# Patient Record
Sex: Male | Born: 1995 | Race: Black or African American | Marital: Single | Smoking: Never smoker
Health system: Southern US, Community
[De-identification: ages and names within clinical notes are randomized; demographics above are authoritative.]

---
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
## 2016-05-02 ENCOUNTER — Encounter: Payer: Self-pay | Admitting: Family Medicine

## 2016-05-02 ENCOUNTER — Ambulatory Visit (INDEPENDENT_AMBULATORY_CARE_PROVIDER_SITE_OTHER): Payer: Medicaid Other | Admitting: Family Medicine

## 2016-05-02 DIAGNOSIS — L089 Local infection of the skin and subcutaneous tissue, unspecified: Secondary | ICD-10-CM

## 2016-05-02 MED ORDER — SULFAMETHOXAZOLE-TRIMETHOPRIM 800-160 MG PO TABS: 1.0000 | ORAL_TABLET | Freq: Two times a day (BID) | ORAL | 0 refills | Status: AC

## 2016-05-02 NOTE — Progress Notes (Signed)
Patient presents today with painful fluid filled lesion on his right third distal digit. Patient states that he noticed this for 5 days ago. He denies any injury to the digit. He denies any fever or chills or myalgias. He states that the trainer tried to express fluid from the area and clear pussy fluid came out. He now has 2 smaller similar areas along the lateral aspect of the same distal digit. He states that making a fist is difficult due to the swelling and pain in the distal digit.  ROS: Negative except mentioned above. Vitals as per Epic GENERAL: NAD SKIN: Right third digit - small open wound (patient states this is the area that popped) along the middle cuticle area, mildly tender, no fluid expressed from that site, 2 smaller vesicular lesions on the lateral distal aspect of the digit, no streaks or erythema appreciated going into the hand, range of motion limited at the DIP joint of the right third digit due to swelling and pain, NV intact NEURO: CN II-XII grossly intact   A/P: Right third digit skin infection - area was cleaned with alcohol, 25-gauge needle was used to express fluid from the smaller vesicular area, wound culture taken, patient tolerated procedure well, discussed warm compresses to the area, patient currently is not doing anything in the weight room due to the fact that he cannot grip correctly, stay out of the whirlpool until fully healed, keep area covered if oozing, Bactrim DS prescribed, Tylenol/Motrin when necessary, have training staff look at the digit daily and follow-up with me if symptoms persist or worsen.

## 2016-05-05 LAB — WOUND CULTURE: Organism ID, Bacteria: NONE SEEN

## 2017-02-07 ENCOUNTER — Encounter: Payer: Self-pay | Admitting: Family Medicine

## 2017-02-07 ENCOUNTER — Ambulatory Visit (INDEPENDENT_AMBULATORY_CARE_PROVIDER_SITE_OTHER): Payer: Self-pay | Admitting: Family Medicine

## 2017-02-07 VITALS — Temp 98.3°F

## 2017-02-07 DIAGNOSIS — M21619 Bunion of unspecified foot: Secondary | ICD-10-CM

## 2017-02-07 MED ORDER — NAPROXEN 500 MG PO TABS: 500.0000 mg | ORAL_TABLET | Freq: Two times a day (BID) | ORAL | 0 refills | Status: DC

## 2017-02-13 ENCOUNTER — Ambulatory Visit
Admission: RE | Admit: 2017-02-13 | Discharge: 2017-02-13 | Disposition: A | Discharge status: Home / Self Care | Payer: Medicaid Other | Source: Ambulatory Visit | Attending: Family Medicine | Admitting: Family Medicine

## 2017-02-13 DIAGNOSIS — M85872 Other specified disorders of bone density and structure, left ankle and foot: Secondary | ICD-10-CM | POA: Insufficient documentation

## 2017-02-13 DIAGNOSIS — M064 Inflammatory polyarthropathy: Secondary | ICD-10-CM | POA: Insufficient documentation

## 2017-02-13 DIAGNOSIS — M2011 Hallux valgus (acquired), right foot: Secondary | ICD-10-CM | POA: Insufficient documentation

## 2017-02-13 DIAGNOSIS — M21619 Bunion of unspecified foot: Secondary | ICD-10-CM

## 2017-02-13 DIAGNOSIS — M85871 Other specified disorders of bone density and structure, right ankle and foot: Secondary | ICD-10-CM | POA: Insufficient documentation

## 2017-04-25 NOTE — Progress Notes (Signed)
Patient presents today with complaints of chronic bilateral foot pain. Patient states that his bunions have been causing him problems for years. He states that it is becoming more difficult to run and football. He denies any new changes in shoewear. He has tried taping in the past which has not helped. He was told in the past that he would need surgery. He is unsure of whether he has had any x-rays recently of his feet. He denies any other joint pain. He denies any alcohol use.  ROS: Negative except mentioned above. Vitals as per Epic GENERAL: NAD MSK: Feet - hallux valgus, pes planus, tenderness of the MTP joint, no erythema or warmth of area, tenderness with flexion and extension of the joint, NV intact NEURO: CN II-XII grossly intact   A/P: Bilateral Hallux Valgus - will do x-rays, will discuss with trainer about changes in shoewear and using a spacer, NSAIDs when necessary, will follow up with Dr. Ardine Engiehl and/or Dr. Eilleen KempfParekh. Patient states that he still wants to play football season. Seek medical attention if any acute or worsening symptoms.

## 2017-10-31 ENCOUNTER — Ambulatory Visit
Admission: RE | Admit: 2017-10-31 | Discharge: 2017-10-31 | Disposition: A | Discharge status: Home / Self Care | Payer: PRIVATE HEALTH INSURANCE | Source: Ambulatory Visit | Attending: Family Medicine | Admitting: Family Medicine

## 2017-10-31 ENCOUNTER — Other Ambulatory Visit: Payer: Self-pay | Admitting: Family Medicine

## 2017-10-31 DIAGNOSIS — M7989 Other specified soft tissue disorders: Secondary | ICD-10-CM | POA: Insufficient documentation

## 2017-10-31 DIAGNOSIS — R52 Pain, unspecified: Secondary | ICD-10-CM

## 2017-10-31 DIAGNOSIS — M79675 Pain in left toe(s): Secondary | ICD-10-CM | POA: Insufficient documentation

## 2017-10-31 DIAGNOSIS — Z9889 Other specified postprocedural states: Secondary | ICD-10-CM | POA: Diagnosis not present

## 2017-10-31 DIAGNOSIS — R609 Edema, unspecified: Secondary | ICD-10-CM

## 2017-12-19 ENCOUNTER — Other Ambulatory Visit: Payer: Self-pay | Admitting: Family Medicine

## 2017-12-19 ENCOUNTER — Ambulatory Visit
Admission: RE | Admit: 2017-12-19 | Discharge: 2017-12-19 | Disposition: A | Discharge status: Home / Self Care | Payer: PRIVATE HEALTH INSURANCE | Source: Ambulatory Visit | Attending: Family Medicine | Admitting: Family Medicine

## 2017-12-19 DIAGNOSIS — M25552 Pain in left hip: Secondary | ICD-10-CM

## 2017-12-19 MED ORDER — NAPROXEN 500 MG PO TABS: 500.0000 mg | ORAL_TABLET | Freq: Two times a day (BID) | ORAL | 0 refills | Status: DC

## 2017-12-21 ENCOUNTER — Ambulatory Visit
Admission: RE | Admit: 2017-12-21 | Discharge: 2017-12-21 | Disposition: A | Discharge status: Home / Self Care | Payer: PRIVATE HEALTH INSURANCE | Source: Ambulatory Visit | Attending: Family Medicine | Admitting: Family Medicine

## 2017-12-21 DIAGNOSIS — X58XXXA Exposure to other specified factors, initial encounter: Secondary | ICD-10-CM | POA: Insufficient documentation

## 2017-12-21 DIAGNOSIS — S76022A Laceration of muscle, fascia and tendon of left hip, initial encounter: Secondary | ICD-10-CM | POA: Diagnosis not present

## 2017-12-21 DIAGNOSIS — M25552 Pain in left hip: Secondary | ICD-10-CM | POA: Diagnosis present

## 2018-02-26 ENCOUNTER — Encounter: Payer: Self-pay | Admitting: Family Medicine

## 2018-02-26 ENCOUNTER — Ambulatory Visit (INDEPENDENT_AMBULATORY_CARE_PROVIDER_SITE_OTHER): Payer: Medicaid Other | Admitting: Family Medicine

## 2018-02-26 VITALS — BP 140/80 | HR 67 | Temp 98.5°F | Resp 14

## 2018-02-26 DIAGNOSIS — R197 Diarrhea, unspecified: Secondary | ICD-10-CM | POA: Diagnosis not present

## 2018-02-26 NOTE — Progress Notes (Signed)
Patient presents today with symptoms of diarrhea.  Patient states that he woke up at 3 AM with abdominal pain and diarrhea.  He denies any vomiting or fever.  He states that he had hibachi chicken for dinner last night.  He has had 3 bouts of diarrhea.  He denies any bloody diarrhea.  He denies any abdominal pain at this time.  He has not taken any medications for his symptoms.  He was able to eat a biscuit from [employer] earlier today and has drank apple juice and water without problems.  He denies any sick contacts.  He denies any alcohol use recently.  ROS: Negative except mentioned above. Vitals as per Epic. GENERAL: NAD HEENT: no pharyngeal erythema, no exudate, no erythema of TMs, no cervical LAD RESP: CTA B CARD: RRR ABD: Positive bowel sounds, nontender, no rebound or guarding appreciated, no flank tenderness NEURO: CN II-XII grossly intact   A/P: Gastroenteritis -possibly related to food poisoning, rest, hydration, start with BRAT Diet and advance as tolerated, no class or athletic activity until diarrhea has resolved, wash hands frequently, seek medical attention if symptoms persist or worsen as discussed.

## 2018-05-21 ENCOUNTER — Other Ambulatory Visit: Payer: Self-pay | Admitting: Family Medicine

## 2018-05-21 DIAGNOSIS — M545 Low back pain, unspecified: Secondary | ICD-10-CM

## 2018-05-22 ENCOUNTER — Ambulatory Visit
Admission: RE | Admit: 2018-05-22 | Discharge: 2018-05-22 | Disposition: A | Discharge status: Home / Self Care | Payer: PRIVATE HEALTH INSURANCE | Attending: Family Medicine | Admitting: Family Medicine

## 2018-05-22 ENCOUNTER — Ambulatory Visit
Admission: RE | Admit: 2018-05-22 | Discharge: 2018-05-22 | Disposition: A | Discharge status: Home / Self Care | Payer: PRIVATE HEALTH INSURANCE | Source: Ambulatory Visit | Attending: Family Medicine | Admitting: Family Medicine

## 2018-05-22 DIAGNOSIS — M545 Low back pain, unspecified: Secondary | ICD-10-CM

## 2018-05-25 ENCOUNTER — Ambulatory Visit (INDEPENDENT_AMBULATORY_CARE_PROVIDER_SITE_OTHER): Payer: Medicaid Other | Admitting: Family Medicine

## 2018-05-25 ENCOUNTER — Encounter: Payer: Self-pay | Admitting: Family Medicine

## 2018-05-25 ENCOUNTER — Ambulatory Visit
Admission: RE | Admit: 2018-05-25 | Discharge: 2018-05-25 | Disposition: A | Discharge status: Home / Self Care | Payer: PRIVATE HEALTH INSURANCE | Source: Ambulatory Visit | Attending: Family Medicine | Admitting: Family Medicine

## 2018-05-25 VITALS — Temp 98.2°F | Resp 14

## 2018-05-25 DIAGNOSIS — M545 Low back pain, unspecified: Secondary | ICD-10-CM

## 2018-05-25 MED ORDER — NAPROXEN 500 MG PO TABS: 500.0000 mg | ORAL_TABLET | Freq: Two times a day (BID) | ORAL | 0 refills | Status: AC

## 2018-05-25 MED ORDER — CYCLOBENZAPRINE HCL 5 MG PO TABS: 5.0000 mg | ORAL_TABLET | Freq: Every day | ORAL | 1 refills | Status: AC

## 2018-05-25 NOTE — Progress Notes (Signed)
Patient presents today with symptoms of lower back pain.  Patient states that he has had symptoms for the last 3 to 4 weeks.  He denies any particular incident when his symptoms started.  He describes his pain as being mostly on the right side and mostly while lying down.  He admits that his back feels "tired".  Denies any radicular symptoms, incontinence, fever, weight loss.  A week ago he was lifting in the weight room and had significant pain on the right side that caused him to drop the weight.  Denies lifting a significant amount of weight however.  He has not been taking any medications because he does not want to mask the symptoms.  Running does cause some discomfort as well.  He has been doing back rehab with the AT/PT. patient did have x-rays done which were read as normal.  ROS: Negative except mentioned above. Vitals as per Epic. GENERAL: NAD MSK: No midline tenderness, mild discomfort in the L3-L4 paravertebral area right greater than left, mild muscle spasm on the right side, mildly decreased flexion and extension due to discomfort, right side bending causes discomfort, negative straight leg raise, normal heel and toe walk, tight hamstrings, poor posture NEURO: CN II-XII groslly intact   A/P: Right lower back pain -given patient's symptoms and length of time will do MRI to further evaluate, patient was prescribed Naprosyn and Flexeril, continue back rehab activities, avoid any activities that cause discomfort that includes in the weight room, this was discussed with the athlete and the athletic trainer, seek medical attention if any acute problems, will see Dr. Ardine Eng for further evaluation/treatment after MRI has been done.

## 2018-09-20 ENCOUNTER — Other Ambulatory Visit: Payer: Self-pay | Admitting: Hematology

## 2018-09-20 DIAGNOSIS — Z20822 Contact with and (suspected) exposure to covid-19: Secondary | ICD-10-CM

## 2018-09-20 NOTE — Progress Notes (Signed)
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 Posey Pronto / Patient scheduled for testing and order placed /

## 2018-09-21 ENCOUNTER — Other Ambulatory Visit: Payer: Medicaid Other

## 2018-10-09 IMAGING — CR DG TOE GREAT 2+V*L*
1 series · 3 of 3 positions shown · non-contrast
Comparison: None.

CLINICAL DATA: Bunion of great toe.  Pain.

EXAM:
LEFT GREAT TOE

[Series 1: dg toe great left · 0.14mm/px · 3 of 3 slices shown]
[im 1/3]
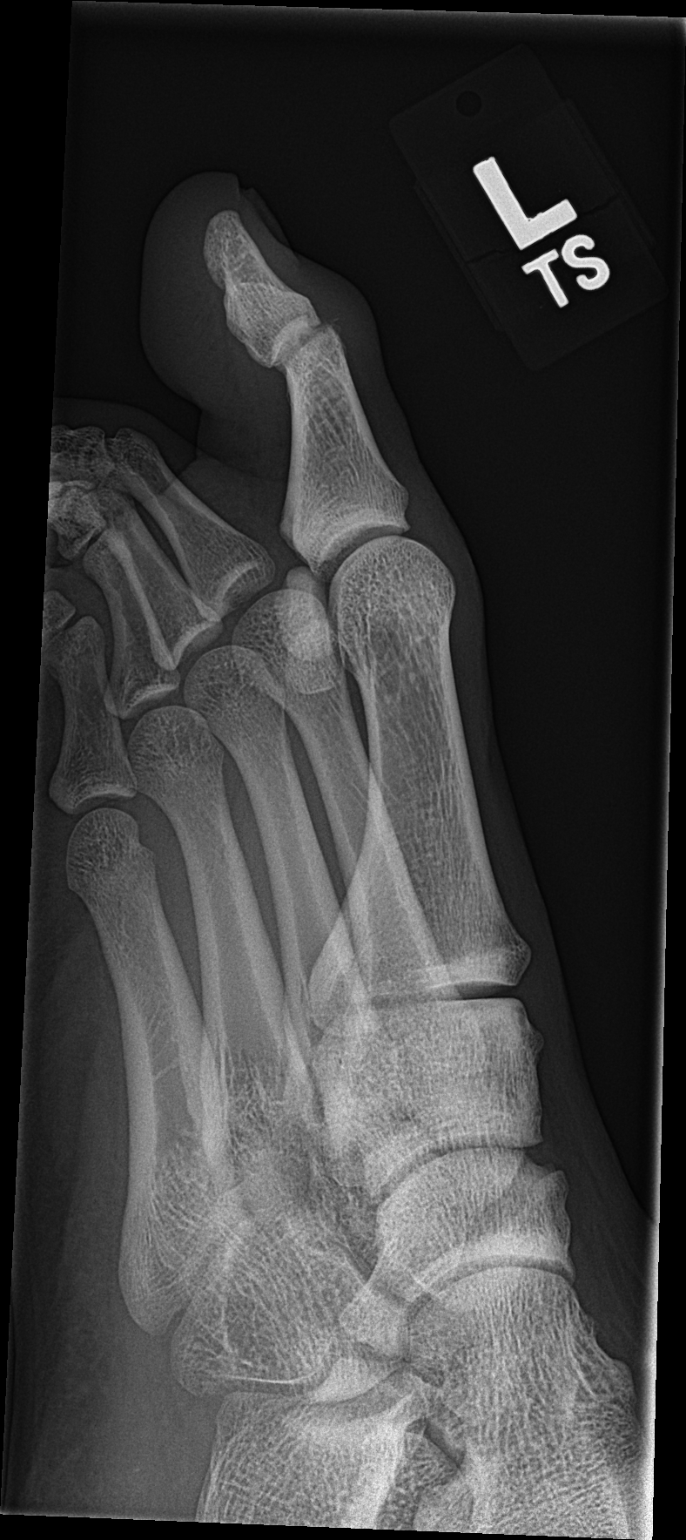
[im 2/3]
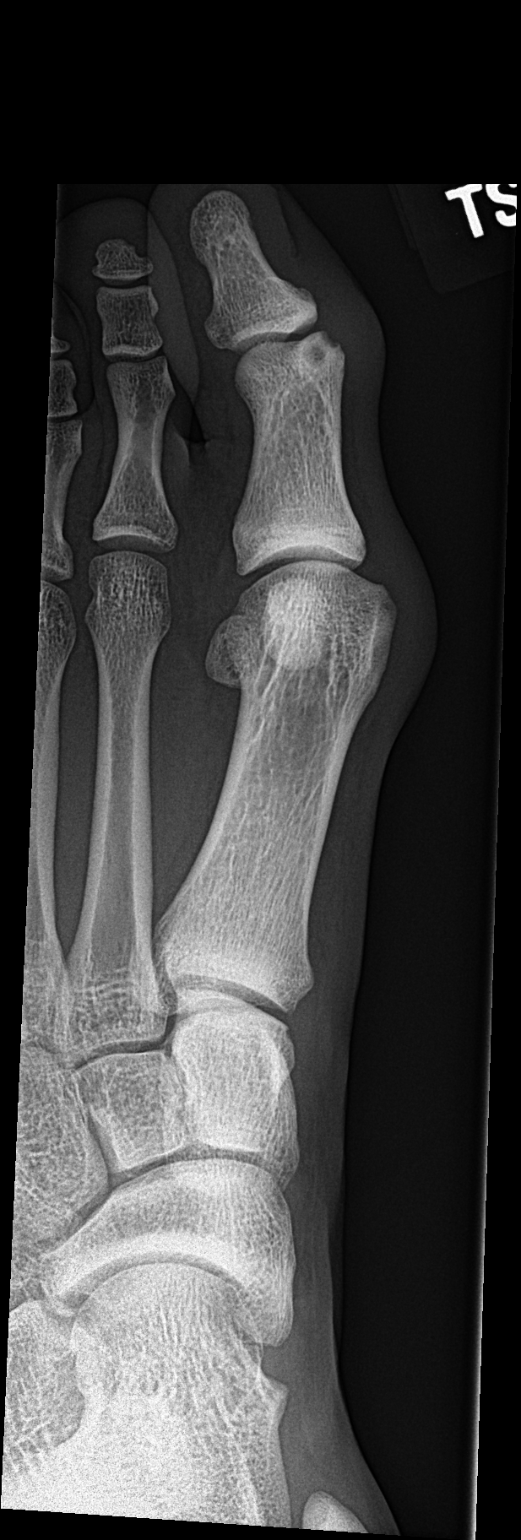
[im 3/3]
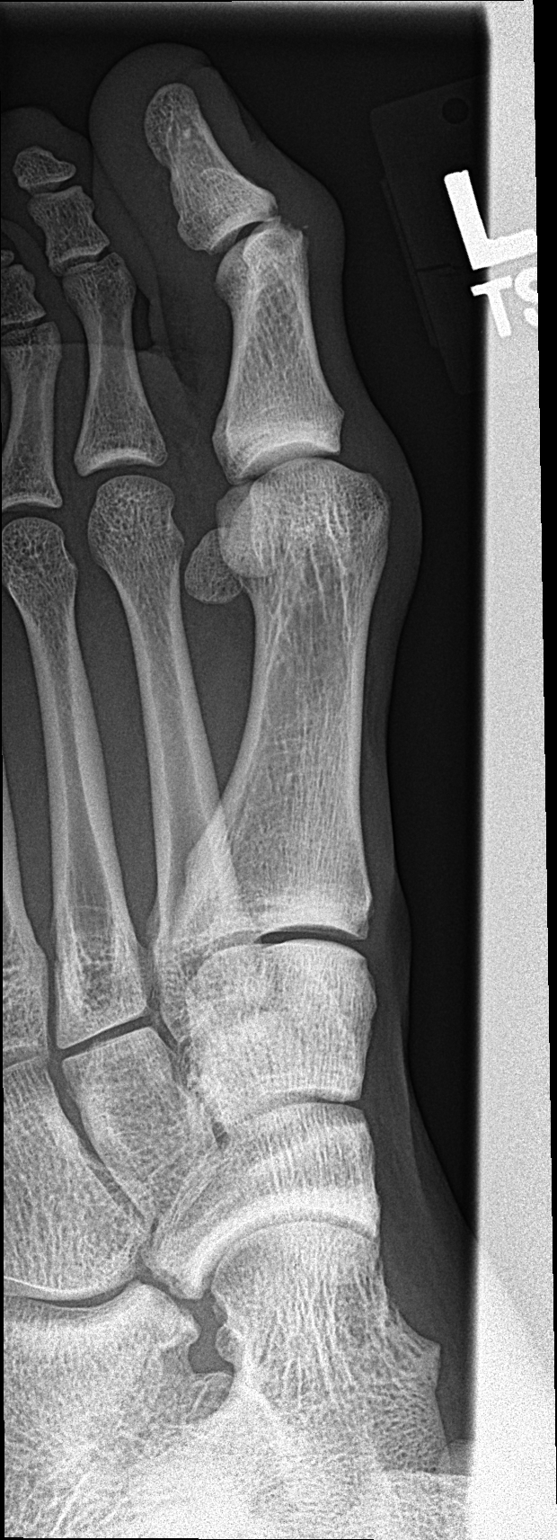

[3 of 3 positions shown; findings below may reference images not displayed]

FINDINGS: There is focal soft tissue swelling overlying the first IP joint.
Periarticular erosion with overhanging age is identified involving
the distal aspect of the proximal phalanx. Several small tiny bone
fragments are identified adjacent to the erosion.
IMPRESSION: 1. Imaging findings are worrisome for inflammatory arthritis.
Presence of erosion with overhanging edge suggests crystalline
deposition disease. Other inflammatory etiologies not excluded. No
acute fracture or subluxation.

## 2018-10-11 ENCOUNTER — Other Ambulatory Visit: Payer: Self-pay | Admitting: *Deleted

## 2018-10-11 DIAGNOSIS — Z20822 Contact with and (suspected) exposure to covid-19: Secondary | ICD-10-CM

## 2018-10-15 ENCOUNTER — Other Ambulatory Visit: Payer: Self-pay

## 2018-10-15 DIAGNOSIS — Z20822 Contact with and (suspected) exposure to covid-19: Secondary | ICD-10-CM

## 2018-10-20 LAB — NOVEL CORONAVIRUS, NAA: SARS-CoV-2, NAA: NOT DETECTED

## 2018-10-23 ENCOUNTER — Other Ambulatory Visit: Payer: Self-pay | Admitting: Family Medicine

## 2018-10-23 ENCOUNTER — Telehealth (INDEPENDENT_AMBULATORY_CARE_PROVIDER_SITE_OTHER): Payer: Medicaid Other | Admitting: Family Medicine

## 2018-10-23 DIAGNOSIS — Z20828 Contact with and (suspected) exposure to other viral communicable diseases: Secondary | ICD-10-CM

## 2018-10-23 DIAGNOSIS — J029 Acute pharyngitis, unspecified: Secondary | ICD-10-CM

## 2018-10-23 DIAGNOSIS — R05 Cough: Secondary | ICD-10-CM | POA: Diagnosis not present

## 2018-10-23 NOTE — Progress Notes (Signed)
Virtual visit accepted by patient.  Patient admits to having sore throat and slight cough since yesterday. Denies any fever, HA, SOB, CP, N/V/D, abdominal pain, skin rash, loss of taste. Has been in contact with someone that is currently asymptomatic and quarantined. Has not been taking any medication. He has a bedroom and a bathroom but shares a kitchen and common place with another football player.  No vitals or exam. A/P: Sore Throat, Cough - will test for COVID-19 today, isolate until further notice after being tested, rest, hydration, Tylenol prn, will check in with athletic trainer daily, I will also call periodically, if any worsening symptoms seek medical attention as discussed. If COVID-19 negative will see in person in office for further evaluation/treatment.

## 2018-10-28 LAB — NOVEL CORONAVIRUS, NAA: SARS-CoV-2, NAA: NOT DETECTED

## 2018-10-30 ENCOUNTER — Other Ambulatory Visit: Payer: Self-pay | Admitting: Family Medicine

## 2018-10-31 LAB — 5+CRT-BUND
Amphetamines, Urine: NEGATIVE ng/mL
Cannabinoid Quant, Ur: NEGATIVE ng/mL
Cocaine (Metab.): NEGATIVE ng/mL
Creatinine, Urine: 346.8 mg/dL — ABNORMAL HIGH (ref 20.0–300.0)
OPIATE QUANTITATIVE URINE: NEGATIVE ng/mL
PCP Quant, Ur: NEGATIVE ng/mL
pH, Urine: 5.7 (ref 4.5–8.9)

## 2018-11-19 ENCOUNTER — Other Ambulatory Visit: Payer: Self-pay

## 2018-11-19 DIAGNOSIS — Z20822 Contact with and (suspected) exposure to covid-19: Secondary | ICD-10-CM

## 2018-11-20 LAB — NOVEL CORONAVIRUS, NAA: SARS-CoV-2, NAA: NOT DETECTED

## 2018-12-31 ENCOUNTER — Other Ambulatory Visit: Payer: Self-pay

## 2018-12-31 DIAGNOSIS — Z20822 Contact with and (suspected) exposure to covid-19: Secondary | ICD-10-CM

## 2019-01-02 LAB — NOVEL CORONAVIRUS, NAA: SARS-CoV-2, NAA: NOT DETECTED

## 2019-02-19 ENCOUNTER — Other Ambulatory Visit: Payer: Self-pay

## 2019-02-19 DIAGNOSIS — Z20822 Contact with and (suspected) exposure to covid-19: Secondary | ICD-10-CM

## 2019-02-21 LAB — NOVEL CORONAVIRUS, NAA: SARS-CoV-2, NAA: NOT DETECTED

## 2019-04-24 ENCOUNTER — Other Ambulatory Visit: Payer: Self-pay | Admitting: Family Medicine

## 2019-04-24 DIAGNOSIS — Z8616 Personal history of COVID-19: Secondary | ICD-10-CM

## 2019-04-24 DIAGNOSIS — I400 Infective myocarditis: Secondary | ICD-10-CM

## 2019-04-24 DIAGNOSIS — U071 COVID-19: Secondary | ICD-10-CM

## 2019-05-03 ENCOUNTER — Other Ambulatory Visit
Admission: RE | Admit: 2019-05-03 | Discharge: 2019-05-03 | Disposition: A | Discharge status: Home / Self Care | Payer: PRIVATE HEALTH INSURANCE | Source: Ambulatory Visit | Attending: Family Medicine | Admitting: Family Medicine

## 2019-05-03 ENCOUNTER — Other Ambulatory Visit: Payer: Self-pay

## 2019-05-03 ENCOUNTER — Ambulatory Visit (INDEPENDENT_AMBULATORY_CARE_PROVIDER_SITE_OTHER): Payer: PRIVATE HEALTH INSURANCE

## 2019-05-03 DIAGNOSIS — Z8616 Personal history of COVID-19: Secondary | ICD-10-CM | POA: Diagnosis not present

## 2019-05-03 DIAGNOSIS — U071 COVID-19: Secondary | ICD-10-CM

## 2019-05-03 DIAGNOSIS — I400 Infective myocarditis: Secondary | ICD-10-CM

## 2019-05-03 LAB — TROPONIN I (HIGH SENSITIVITY): Troponin I (High Sensitivity): 5 ng/L (ref <18)

## 2019-05-03 NOTE — Addendum Note (Signed)
Addended by: Deloria Lair on: 05/03/2019 09:57 AM   Modules accepted: Orders

## 2019-06-26 IMAGING — CR DG TOE GREAT 2+V*L*
1 series · 3 of 3 positions shown · non-contrast
Comparison: Left great toe images of 02/13/2017

CLINICAL DATA: Pain and swelling of the left great toe. The patient
had surgery on the great toe in [REDACTED]

EXAM:
LEFT GREAT TOE

[Series 1: dg toe great left · 0.14mm/px · 3 of 3 slices shown]
[im 1/3]
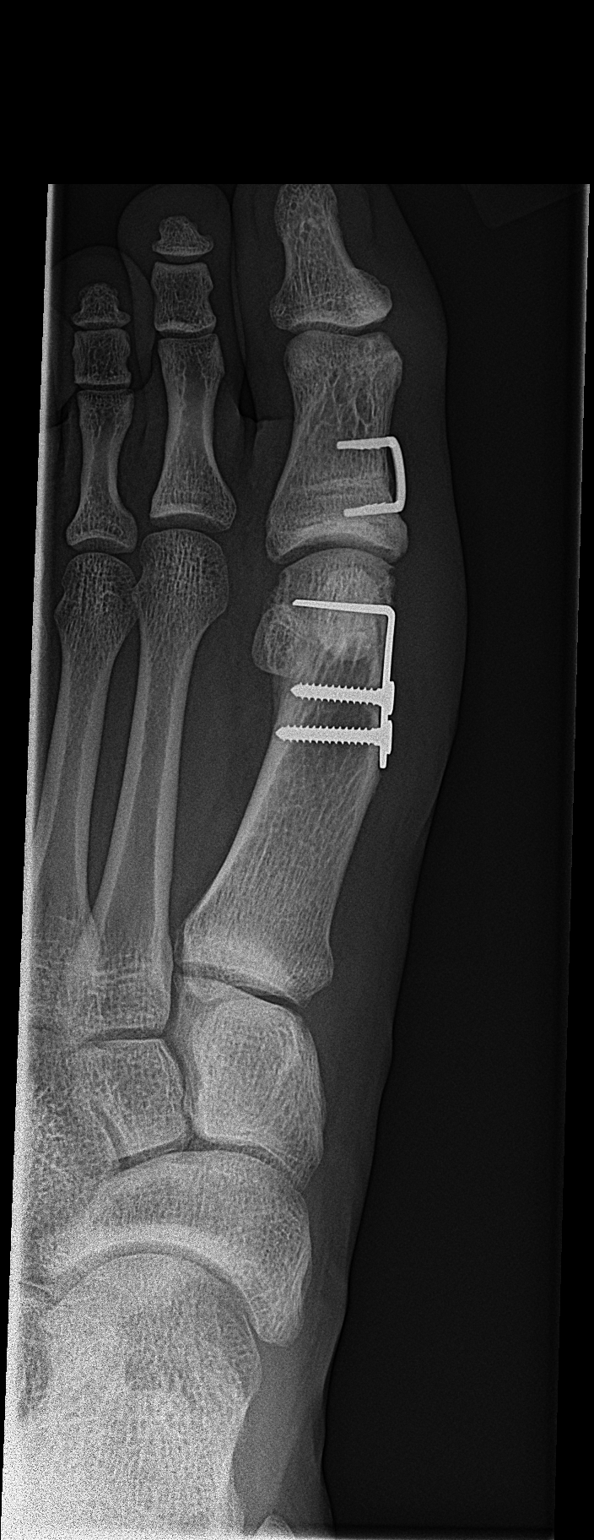
[im 2/3]
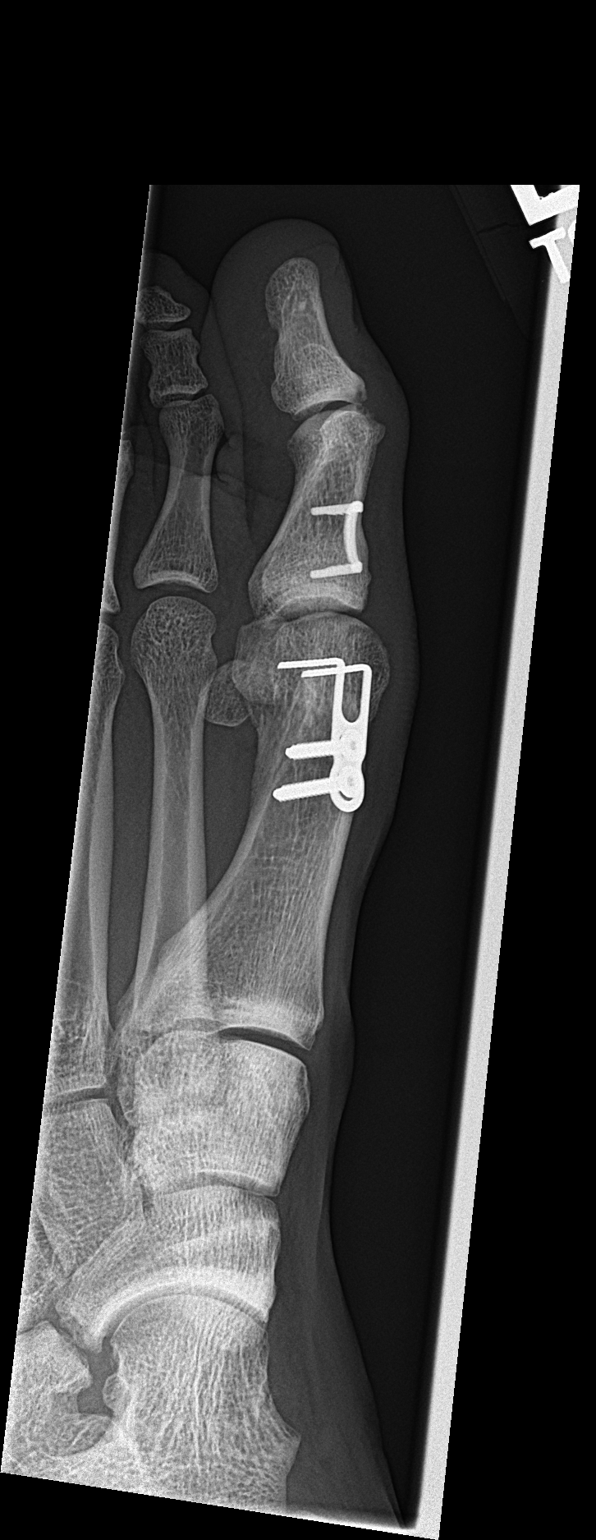
[im 3/3]
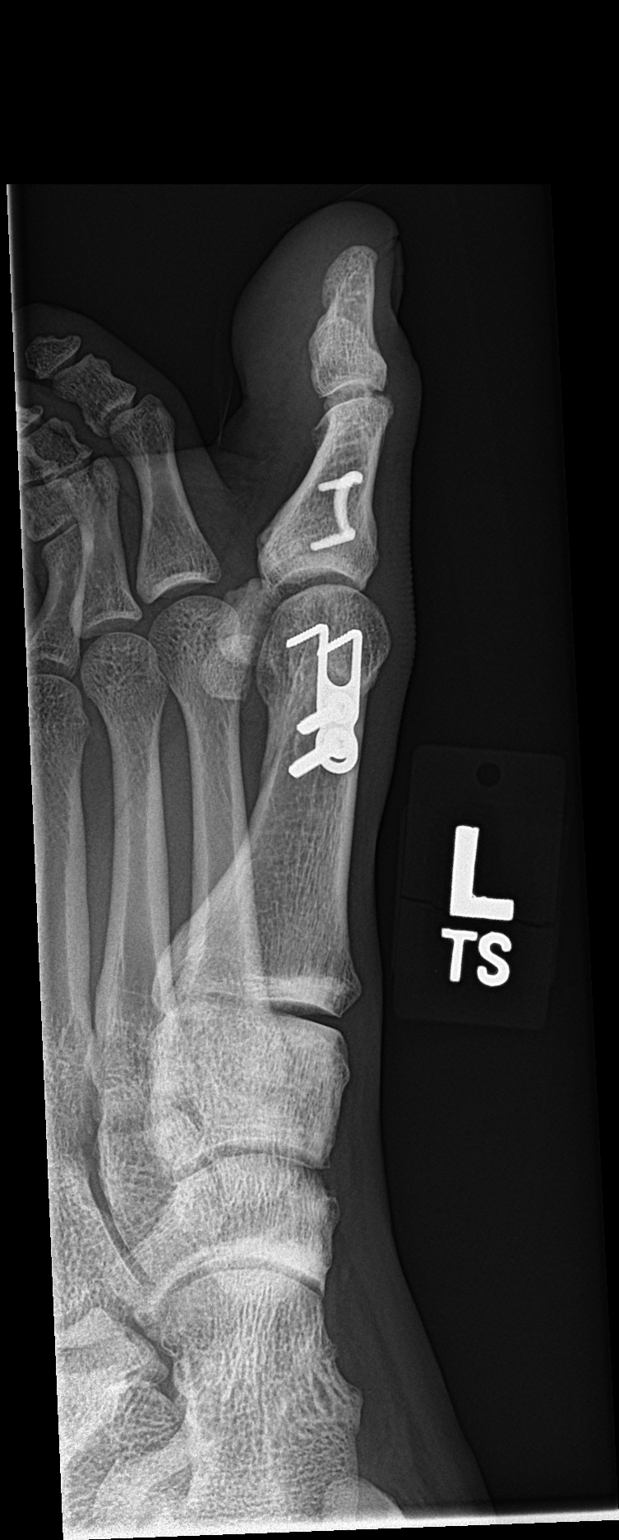

[3 of 3 positions shown; findings below may reference images not displayed]

FINDINGS: The patient appears of undergone are stay item E of the distal first
metacarpal and the base of the proximal phalanx of the left great
toe with fixation pins and screws present. No acute fracture is
seen. Possible erosion involving the left first DIP joint is again
noted.
IMPRESSION: 1. Postoperative changes involve the distal left first metatarsal
and proximal phalanx of the left great toe.
2. Possible erosion involving the left first DIP joint.

## 2019-08-12 ENCOUNTER — Other Ambulatory Visit: Payer: Self-pay

## 2019-08-12 ENCOUNTER — Ambulatory Visit: Payer: Medicaid Other | Admitting: Family

## 2019-08-12 VITALS — BP 150/77 | HR 74 | Temp 98.7°F | Resp 14

## 2019-08-12 DIAGNOSIS — M25862 Other specified joint disorders, left knee: Secondary | ICD-10-CM

## 2019-08-12 NOTE — Progress Notes (Signed)
   Acute Office Visit  Subjective:    Patient ID: John Gibbs, male    DOB: June 25, 1995, 24 y.o.   MRN: [id number]  Chief Complaint  Patient presents with  . Knee Pain    Patient complaints of a left knee pain and injury that happen a month ago.    Knee Pain    Patient is in today with c/o a bump on his left knee.  Pt had significant swelling to his left knee over 1 month ago.  Saw Dr. Ardine Eng (the orthopedist) and swelling eventually resolved.  He has noticed a lingering "bump" to that area, not painful.   No past medical history on file.  No past surgical history on file.   No Known Allergies      Objective:    Physical Exam Musculoskeletal:     Comments: Pecan-sized nodule to left knee at 1 o'clock.  Mobile, non tender. Solid, not fluid filled.     BP (!) 150/77   Pulse 74   Temp 98.7 F (37.1 C) (Temporal)   Resp 14   SpO2 100%  Wt Readings from Last 3 Encounters:  No data found for Wt      Assessment & Plan:   Problem List Items Addressed This Visit    None    Visit Diagnoses    Cyst of left knee joint    -  Primary    Reviewed exam findings.  Advised pt to follow up as scheduled with Dr. Ardine Eng tonight for further eval and plan.  Rtc prn.   No orders of the defined types were placed in this encounter.    Telesforo Brosnahan, Deirdre Peer, NP

## 2019-08-14 IMAGING — CR DG HIP (WITH OR WITHOUT PELVIS) 2-3V*L*
1 series · 4 of 4 positions shown · non-contrast
Comparison: None.

CLINICAL DATA: Fell playing football.  Left anterior rib pain.

EXAM:
DG HIP (WITH OR WITHOUT PELVIS) 2-3V LEFT

[Series 1: dg hip unilat w or w/o pelvis 2-3 views  · non-contrast · 0.14mm/px · 4 of 4 slices shown]
[im 1/4]
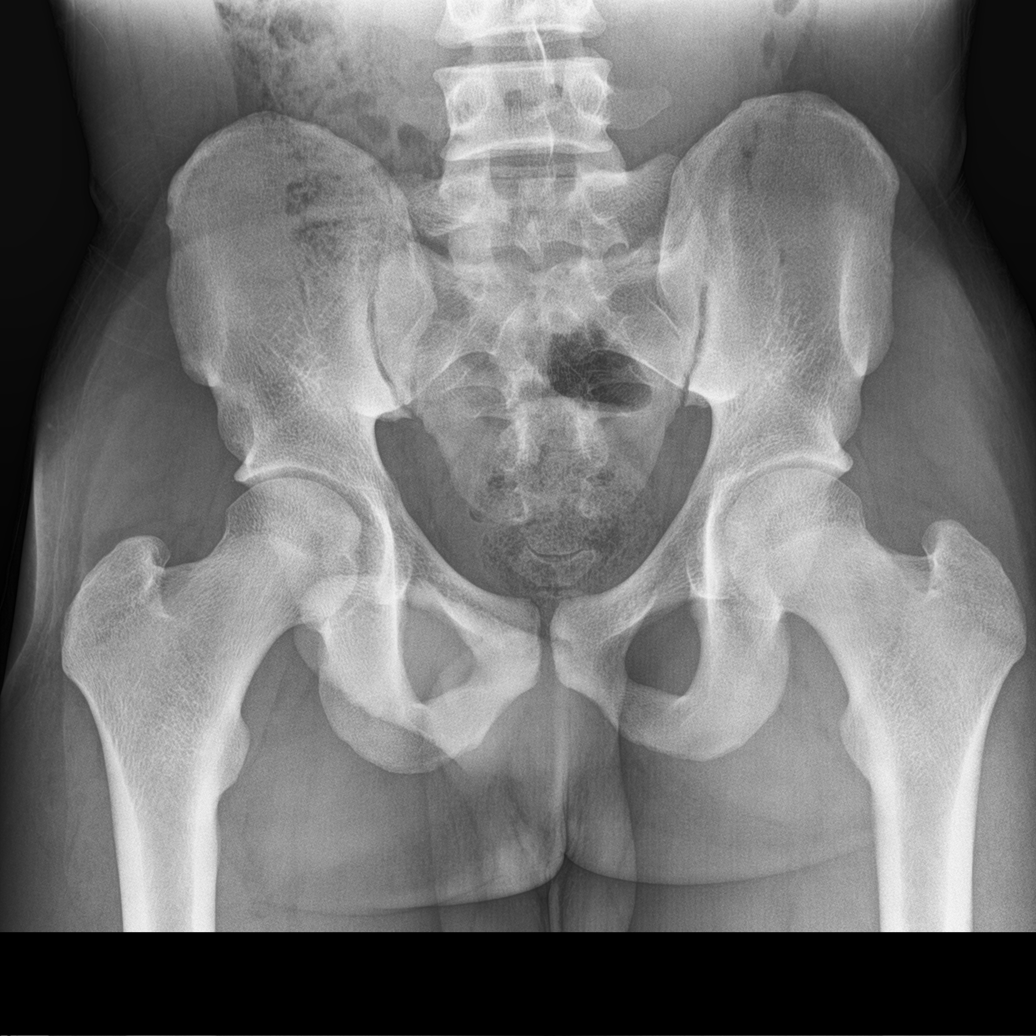
[im 2/4]
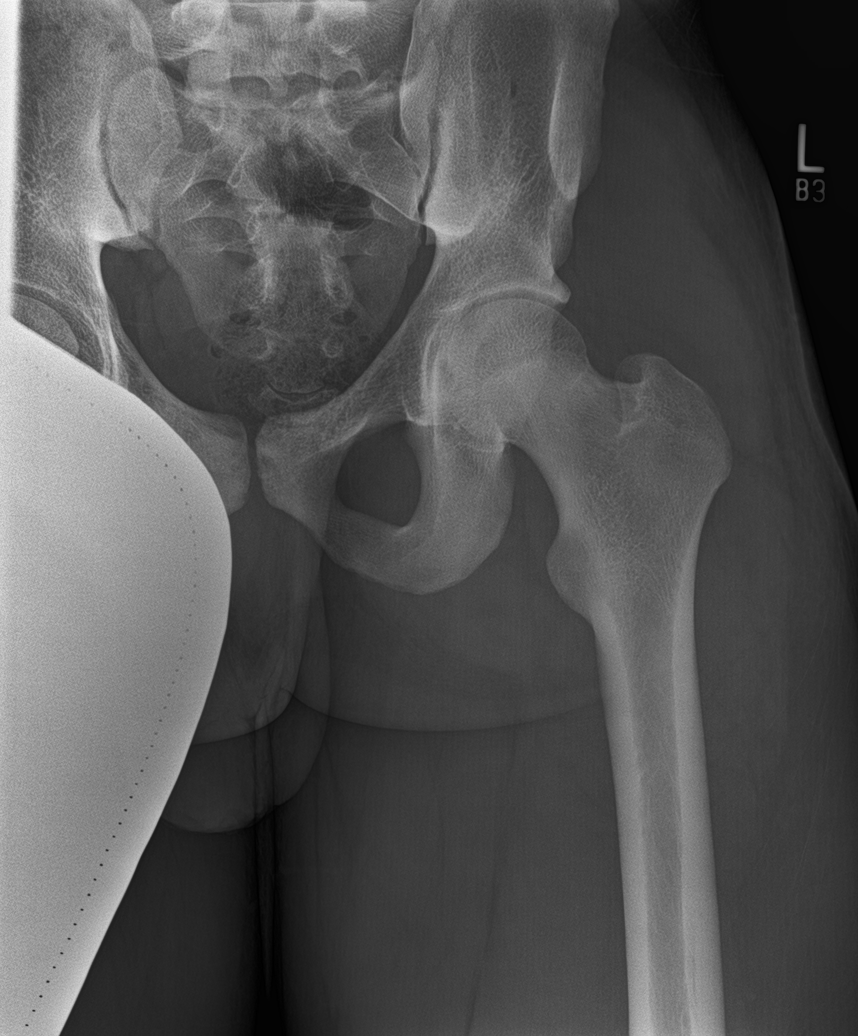
[im 3/4]
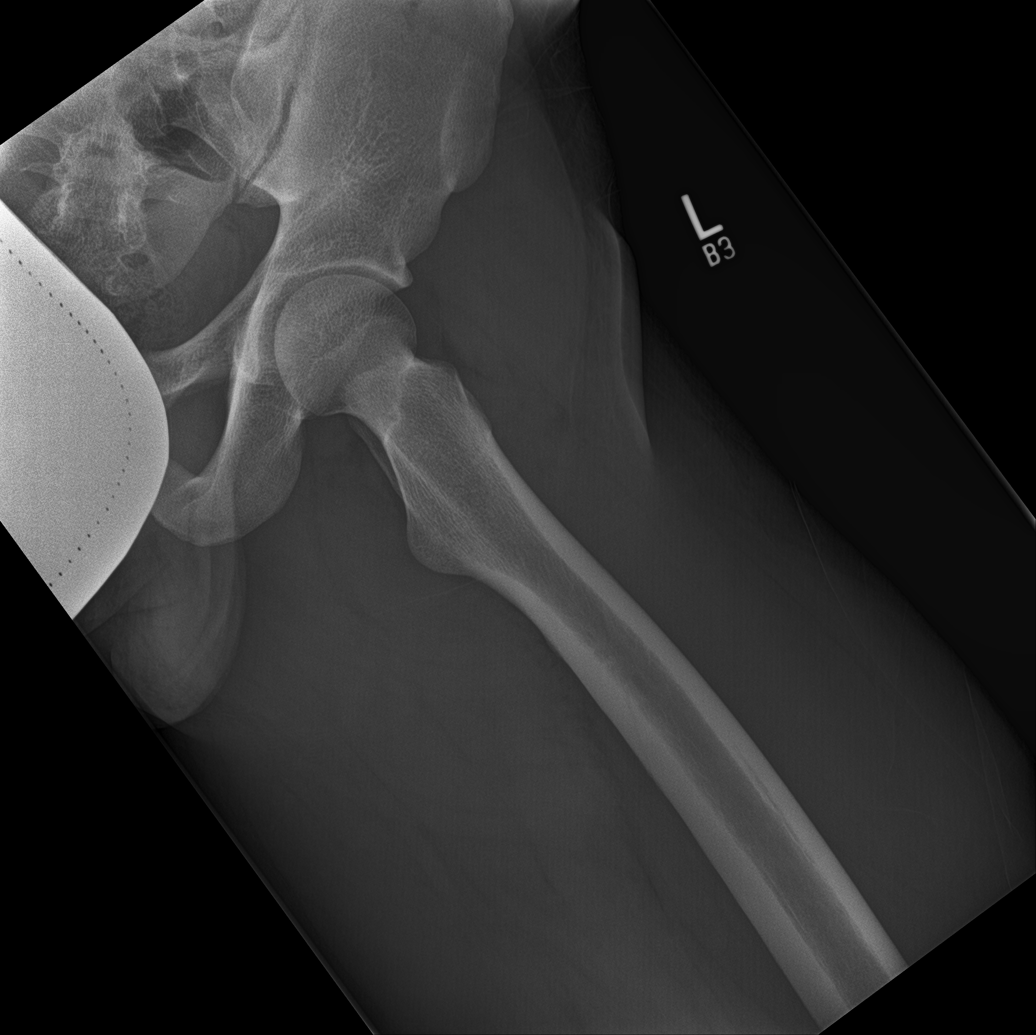
[im 4/4]
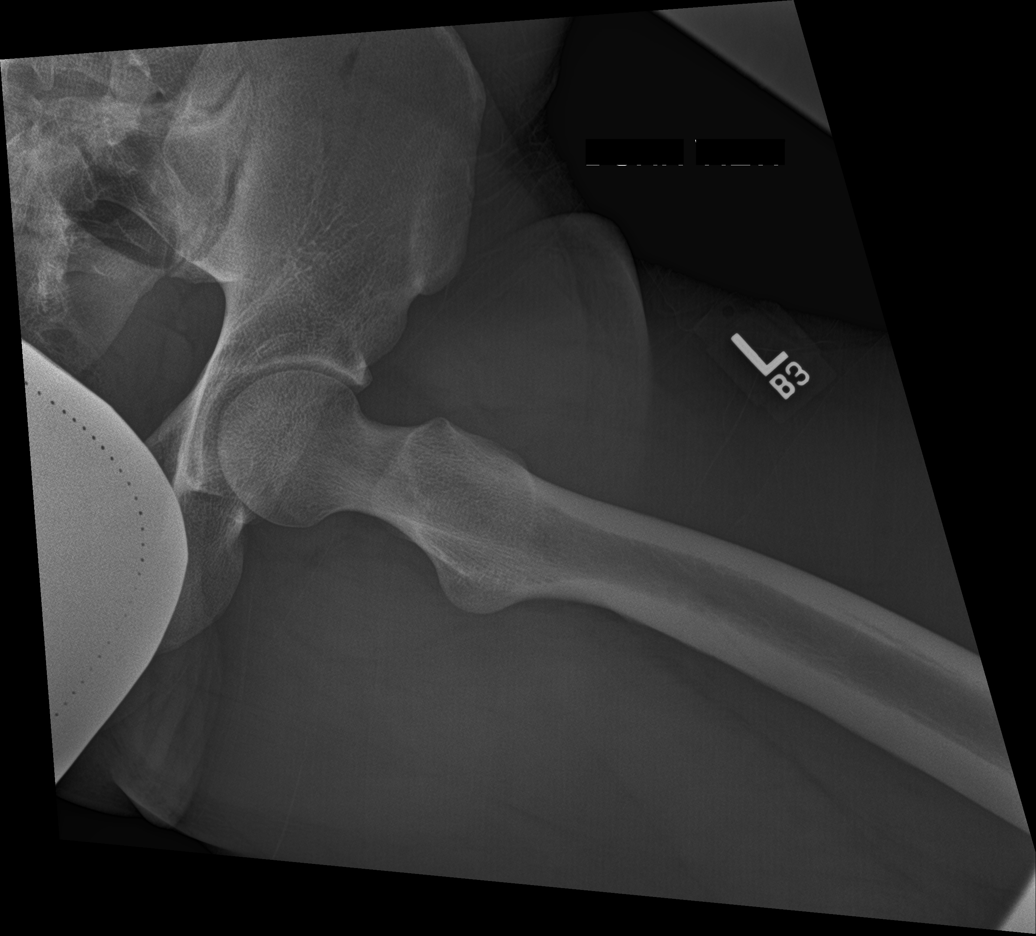

[4 of 4 positions shown; findings below may reference images not displayed]

FINDINGS: Both hips are normally located. No hip fracture or AVN. The pubic
symphysis and SI joints appear normal. No pelvic fractures or bone
lesions. Partial sacralization of L5 on the left is noted.
IMPRESSION: No acute bony findings.

## 2020-01-15 IMAGING — CR DG LUMBAR SPINE COMPLETE 4+V
1 series · 6 of 6 positions shown · non-contrast
Comparison: None.

CLINICAL DATA: Pt states no injury, pain across lower back for 3-4
weeks, no sciatica. shielded

EXAM:
LUMBAR SPINE - COMPLETE 4+ VIEW

[Series 1: dg lumbar spine complete 4 +v · 0.14mm/px · 6 of 6 slices shown]
[im 1/6]
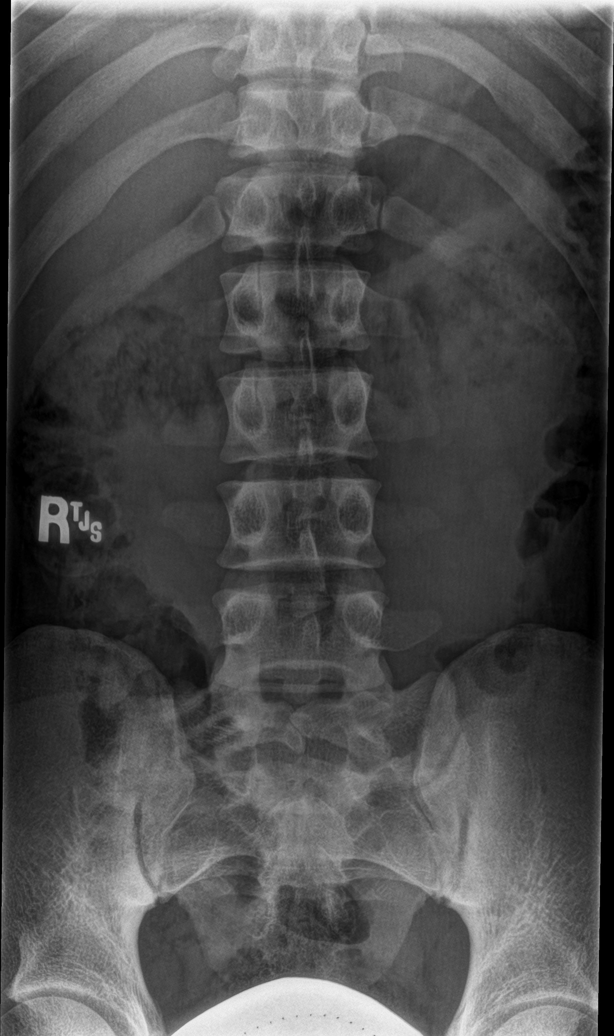
[im 2/6]
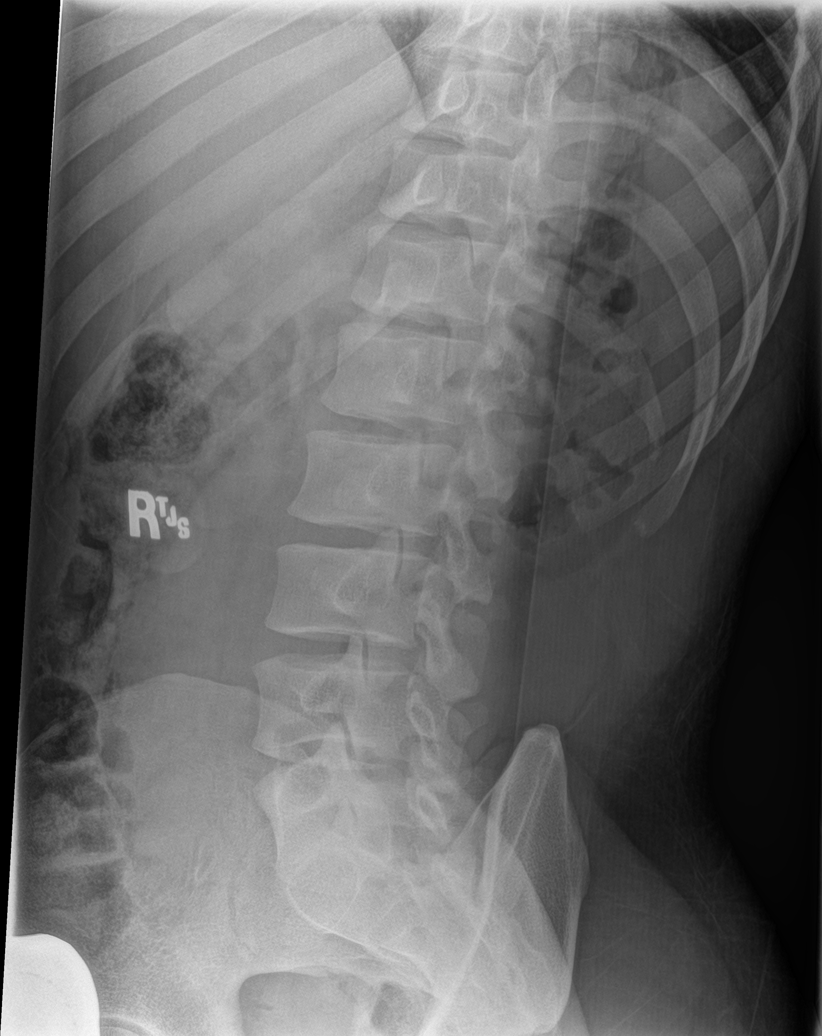
[im 3/6]
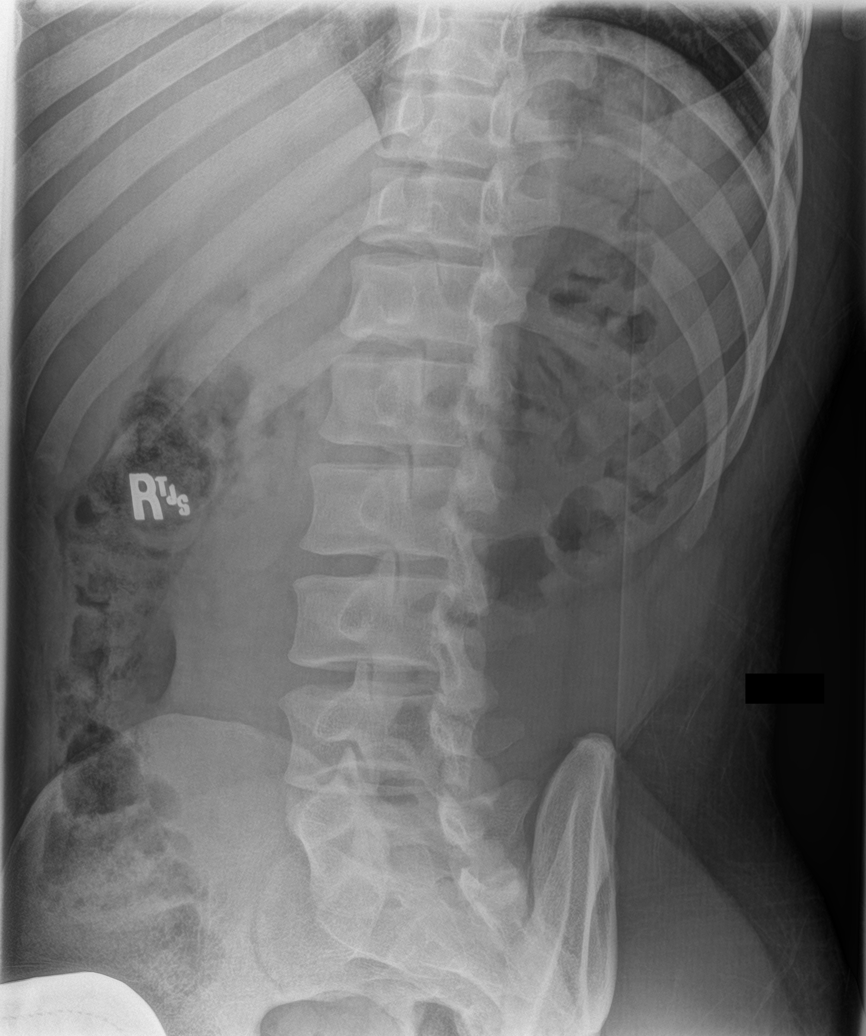
[im 4/6]
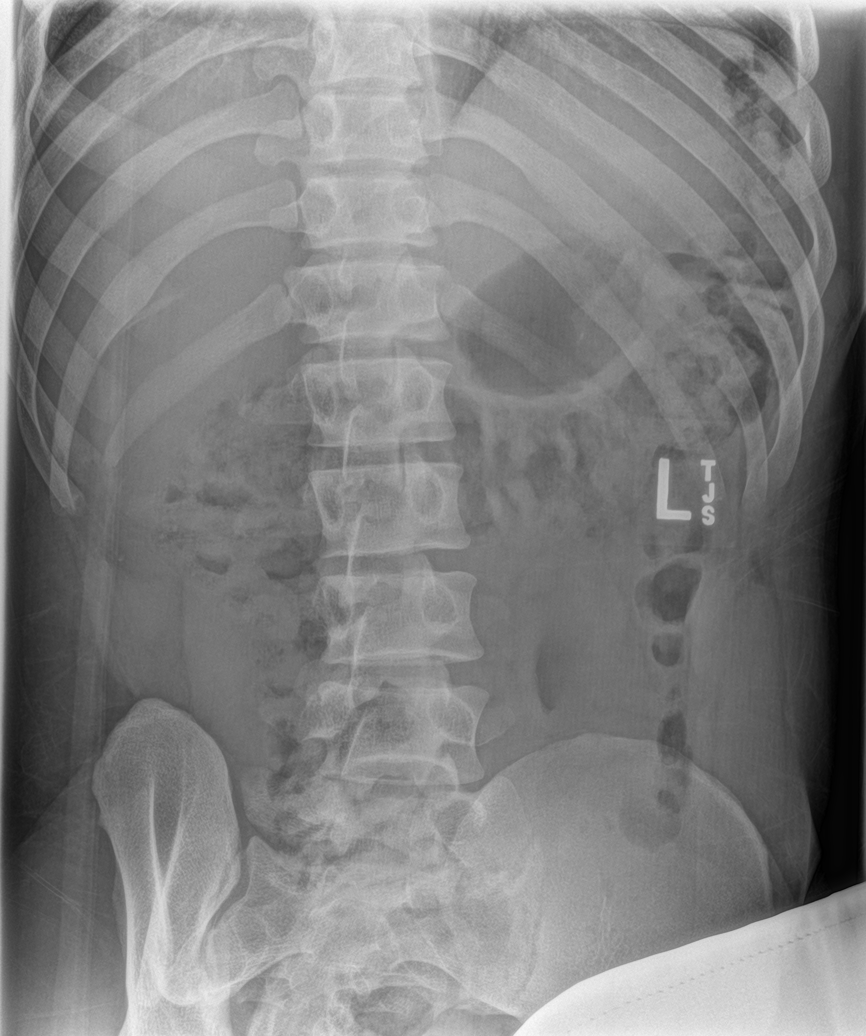
[im 5/6]
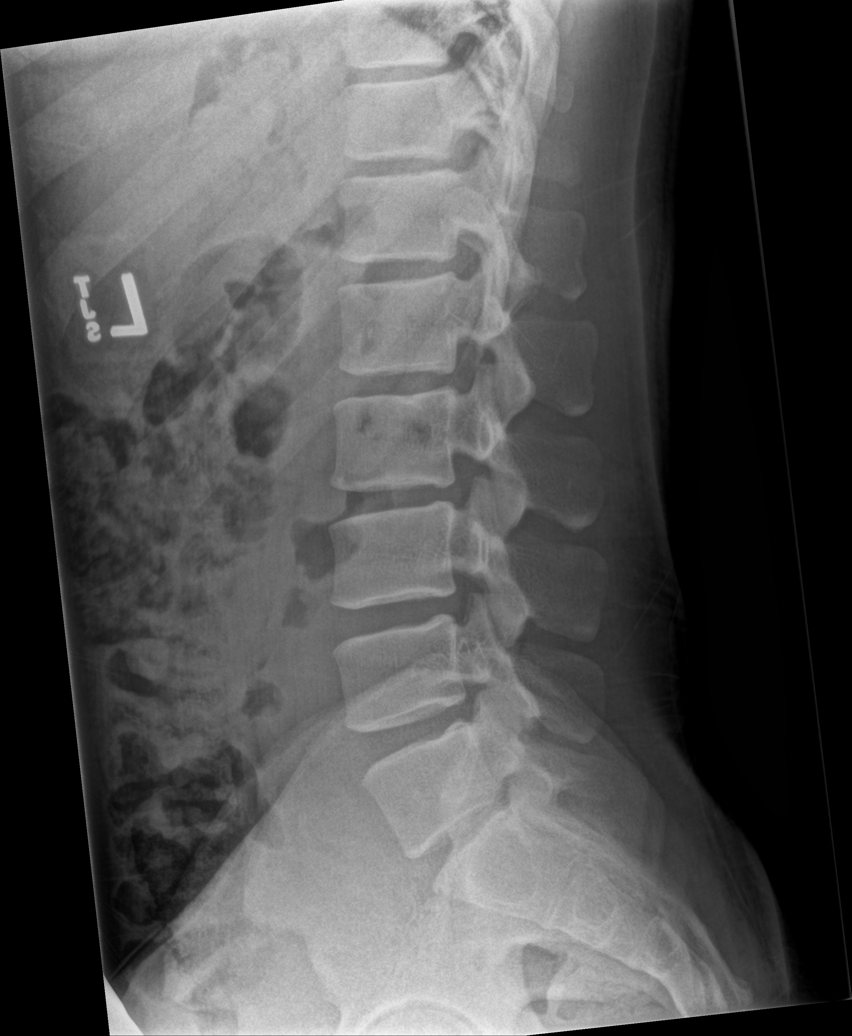
[im 6/6]
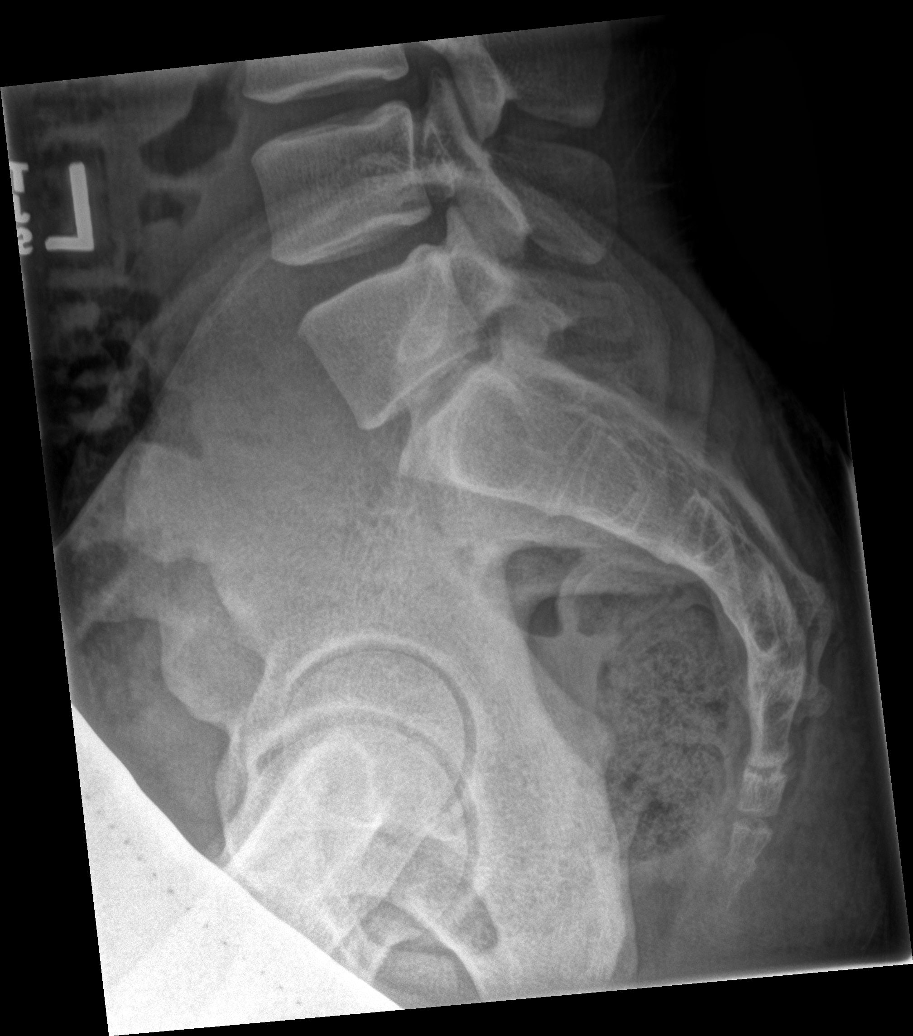

[6 of 6 positions shown; findings below may reference images not displayed]

FINDINGS: There is no evidence of lumbar spine fracture. Alignment is normal.
Intervertebral disc spaces are maintained.
IMPRESSION: Negative.
